# Patient Record
Sex: Male | Born: 1995 | Race: White | Hispanic: No | Marital: Single
Health system: Southern US, Community
[De-identification: ages and names within clinical notes are randomized; demographics above are authoritative.]

---
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
## 2017-07-15 ENCOUNTER — Emergency Department (HOSPITAL_COMMUNITY): Payer: No Typology Code available for payment source

## 2017-07-15 ENCOUNTER — Observation Stay (HOSPITAL_COMMUNITY)
Admission: EM | Admit: 2017-07-15 | Discharge: 2017-07-16 | Disposition: A | Discharge status: Nursing Home (Includes ICF) | Payer: No Typology Code available for payment source | Attending: Emergency Medicine | Admitting: Emergency Medicine

## 2017-07-15 ENCOUNTER — Other Ambulatory Visit: Payer: Self-pay

## 2017-07-15 DIAGNOSIS — S91319A Laceration without foreign body, unspecified foot, initial encounter: Secondary | ICD-10-CM | POA: Diagnosis present

## 2017-07-15 DIAGNOSIS — M545 Low back pain: Secondary | ICD-10-CM | POA: Insufficient documentation

## 2017-07-15 DIAGNOSIS — W19XXXA Unspecified fall, initial encounter: Secondary | ICD-10-CM

## 2017-07-15 DIAGNOSIS — M25572 Pain in left ankle and joints of left foot: Secondary | ICD-10-CM | POA: Diagnosis present

## 2017-07-15 DIAGNOSIS — Y990 Civilian activity done for income or pay: Secondary | ICD-10-CM | POA: Diagnosis not present

## 2017-07-15 DIAGNOSIS — S91312A Laceration without foreign body, left foot, initial encounter: Secondary | ICD-10-CM | POA: Diagnosis not present

## 2017-07-15 DIAGNOSIS — W11XXXA Fall on and from ladder, initial encounter: Secondary | ICD-10-CM | POA: Insufficient documentation

## 2017-07-15 MED ORDER — ONDANSETRON HCL 4 MG/2ML IJ SOLN
4.0000 mg | Freq: Once | INTRAMUSCULAR | Status: AC
  Administered 2017-07-15: 4 mg via INTRAVENOUS
  Filled 2017-07-15: qty 2

## 2017-07-15 MED ORDER — TETANUS-DIPHTH-ACELL PERTUSSIS 5-2.5-18.5 LF-MCG/0.5 IM SUSP
0.5000 mL | Freq: Once | INTRAMUSCULAR | Status: AC
  Administered 2017-07-15: 0.5 mL via INTRAMUSCULAR
  Filled 2017-07-15: qty 0.5

## 2017-07-15 MED ORDER — HYDROMORPHONE HCL 1 MG/ML IJ SOLN
0.7500 mg | Freq: Once | INTRAMUSCULAR | Status: AC
  Administered 2017-07-15: 0.75 mg via INTRAVENOUS
  Filled 2017-07-15: qty 1

## 2017-07-15 MED ORDER — HYDROMORPHONE HCL 1 MG/ML IJ SOLN
0.5000 mg | INTRAMUSCULAR | Status: DC | PRN
  Administered 2017-07-16: 1 mg via INTRAVENOUS
  Filled 2017-07-15: qty 1

## 2017-07-15 MED ORDER — CLINDAMYCIN PHOSPHATE 600 MG/50ML IV SOLN
600.0000 mg | Freq: Once | INTRAVENOUS | Status: AC
  Administered 2017-07-15: 600 mg via INTRAVENOUS
  Filled 2017-07-15: qty 50

## 2017-07-15 MED ORDER — SODIUM CHLORIDE 0.9 % IV SOLN
INTRAVENOUS | Status: AC
  Administered 2017-07-15: 1000 mL via INTRAVENOUS

## 2017-07-15 MED ORDER — LIDOCAINE-EPINEPHRINE (PF) 2 %-1:200000 IJ SOLN
20.0000 mL | Freq: Once | INTRAMUSCULAR | Status: DC
  Filled 2017-07-15: qty 20

## 2017-07-15 MED ORDER — BUPIVACAINE HCL 0.25 % IJ SOLN
20.0000 mL | Freq: Once | INTRAMUSCULAR | Status: AC
  Administered 2017-07-15: 20 mL
  Filled 2017-07-15: qty 20

## 2017-07-15 MED ORDER — HYDROMORPHONE HCL 1 MG/ML IJ SOLN
1.0000 mg | Freq: Once | INTRAMUSCULAR | Status: DC
  Filled 2017-07-15 (×2): qty 1

## 2017-07-15 MED ORDER — SODIUM CHLORIDE 0.9 % IV BOLUS: 125.0000 mL | Freq: Once | INTRAVENOUS | Status: DC

## 2017-07-15 MED ORDER — HYDROMORPHONE HCL 1 MG/ML IJ SOLN
1.0000 mg | Freq: Once | INTRAMUSCULAR | Status: AC
  Administered 2017-07-15: 1 mg via INTRAVENOUS

## 2017-07-15 MED ORDER — HYDROMORPHONE HCL 1 MG/ML IJ SOLN
0.5000 mg | Freq: Once | INTRAMUSCULAR | Status: AC
  Administered 2017-07-15: 0.5 mg via INTRAVENOUS
  Filled 2017-07-15: qty 1

## 2017-07-15 NOTE — ED Provider Notes (Signed)
MSE was initiated and I personally evaluated the patient and placed orders (if any) at  3:09 PM on Jul 15, 2017.  Logan Long is a 22 y.o. male who presents to the ED for a laceration to the bottom of his left heel, left foot and ankle pain and right ankle and knee pain and low back pain. Patient stepped down a ladder and turned his ankle inside his his boot. Patient fell and landed on his feet. Patient c/o severe pain bilateral feet and ankles and right knee. Patient c/o laceration to the left foot. Patient also with low back pain. The injury happened just prior to coming to the ED.   BP 120/74 (BP Location: Left Arm)   Pulse (!) 101   Temp 98.1 F (36.7 C) (Oral)   Resp (!) 22   Wt 117.9 kg (260 lb)   SpO2 97%   Deep laceration to the plantar aspect of the left foot, bleeding controlled. Swelling bilateral ankles, tender on palpation. Right knee tender with range of motion.  X-rays, IV, pain management with Dilaudid, Zofran for nausea.  The patient appears stable so that the remainder of the MSE may be completed by another provider.   Kerrie Buffalo [place], [place] 07/15/17 1516    Nira Conn, MD 07/15/17 [phone]

## 2017-07-15 NOTE — ED Triage Notes (Signed)
Deep open laceration to l/foot.Bleeding not controlled.  Pt stepped down off a ladder and turned his ankle onto the side of a steel boot. Pt stated that he landed on his r/foot also. 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 Injury was approx. 30 minutes ago.Pt stated that he fell from top of ten foot ladder. He was stepping  Off a ladder and missed a step.

## 2017-07-15 NOTE — ED Provider Notes (Signed)
[facility]-EMERGENCY DEPT Provider Note   CSN: [id number] Arrival date & time: 07/15/17  1439     History   Chief Complaint Chief Complaint  Patient presents with  . Ankle Pain  . Fall  . Extremity Laceration    HPI Logan Long is a 22 y.o. male who presents today for evaluation of bilateral heel pain.  He reports that he was at work when he was up on a ladder, he misstepped causing him to fall 10 to 12 feet landing on his bilateral feet.  He reports sudden onset of pain in both of his feet, and very mild lower back pain.  He denies striking his head, stating that "I stuck the landing."  He denies taking any blood thinning medications, reports that he is otherwise healthy.  He reports that he has a laceration on his left foot, is unsure when his last tetanus shot was.  He denies any other injuries, did not strike his head, no neck pain.  HPI  History reviewed. No pertinent past medical history.  Patient Active Problem List   Diagnosis Date Noted  . Foot laceration 07/15/2017    History reviewed. No pertinent surgical history.      Home Medications    Prior to Admission medications   Medication Sig Start Date End Date Taking? Authorizing Provider  Liniments (VICKS BABYRUB) CREA Apply 1 application topically daily as needed (congestion).   Yes [provider]    Family History History reviewed. No pertinent family history.  Social History Social History   Tobacco Use  . Smoking status: Not on file  Substance Use Topics  . Alcohol use: Not on file  . Drug use: Not on file     Allergies   Cefdinir and Rocephin [ceftriaxone sodium in dextrose]   Review of Systems Review of Systems  Constitutional: Negative for chills and fever.  HENT: Negative for ear pain and sore throat.   Eyes: Negative for pain and visual disturbance.  Respiratory: Negative for cough and shortness of breath.   Cardiovascular: Negative for chest pain and  palpitations.  Gastrointestinal: Negative for abdominal pain and vomiting.  Genitourinary: Negative for dysuria and hematuria.  Musculoskeletal: Negative for arthralgias and back pain.       Pain in bilateral feet/ankles, mild low back pain.  Skin: Negative for color change and rash.  Neurological: Negative for seizures, syncope and headaches.  All other systems reviewed and are negative.    Physical Exam Updated Vital Signs BP 117/80 (BP Location: Left Arm)   Pulse 83   Temp 98.1 F (36.7 C) (Oral)   Resp 18   Ht 5' 6.5" (1.689 m)   Wt 117.9 kg (260 lb)   SpO2 100%   BMI 41.34 kg/m   Physical Exam  Constitutional: He is oriented to person, place, and time. He appears well-developed and well-nourished. No distress.  HENT:  Head: Normocephalic and atraumatic.  Mouth/Throat: Oropharynx is clear and moist.  Eyes: Conjunctivae are normal.  Neck: Normal range of motion. Neck supple.  Cardiovascular: Normal rate, regular rhythm and intact distal pulses.  No murmur heard. 2+ DP/PT pulses bilaterally.  Pulmonary/Chest: Effort normal and breath sounds normal. No respiratory distress.  Abdominal: Soft. There is no tenderness.  Musculoskeletal: He exhibits no edema.  Diffuse tenderness to palpation over bilateral heels.  Neurological: He is alert and oriented to person, place, and time.  Sensation intact to bilateral lower extremities.  Skin: Skin is warm and dry. He  is not diaphoretic.  There is a 10 cm laceration over the medial aspect of the left heel.  Please see clinical images.  Psychiatric: He has a normal mood and affect.  Nursing note and vitals reviewed.    After irrigation, Minimal debridement of non viable skin.       ED Treatments / Results  Labs (all labs ordered are listed, but only abnormal results are displayed) Labs Reviewed - No data to display  EKG None  Radiology Dg Lumbar Spine Complete  Result Date: 07/15/2017 CLINICAL DATA:  Larey Seat from  ladder. LEFT foot laceration. Ankle swelling and pain. Low back pain. EXAM: LUMBAR SPINE - COMPLETE 4+ VIEW COMPARISON:  None. FINDINGS: Five non rib-bearing lumbar-type vertebral bodies are intact and aligned with maintenance of the lumbar lordosis. Intervertebral disc heights are normal. No destructive bony lesions. Sacroiliac joints are symmetric. Included prevertebral and paraspinal soft tissue planes are non-suspicious. IMPRESSION: Negative. Electronically Signed   By: Awilda Metro M.D.   On: 07/15/2017 16:36   Dg Ankle Complete Left  Result Date: 07/15/2017 CLINICAL DATA:  Larey Seat from ladder. LEFT foot laceration. Ankle swelling and pain. Low back pain. EXAM: LEFT FOOT - COMPLETE 3+ VIEW; LEFT ANKLE COMPLETE - 3+ VIEW COMPARISON:  None. FINDINGS: LEFT foot: There is no evidence of fracture or dislocation. There is no evidence of arthropathy or other focal bone abnormality. Soft tissues are unremarkable. LEFT ankle: No fracture deformity nor dislocation. Mild anterior and posterior tibial periarticular spurring. The ankle mortise appears congruent and the tibiofibular syndesmosis intact. No destructive bony lesions. Soft tissue planes are non-suspicious. IMPRESSION: Negative. Electronically Signed   By: Awilda Metro M.D.   On: 07/15/2017 16:33   Dg Ankle Complete Right  Result Date: 07/15/2017 CLINICAL DATA:  Larey Seat from ladder. LEFT foot laceration. Ankle swelling and pain. Low back pain. EXAM: RIGHT FOOT COMPLETE - 3+ VIEW; RIGHT ANKLE - COMPLETE 3+ VIEW COMPARISON:  None. FINDINGS: RIGHT foot: There is no evidence of fracture or dislocation. There is no evidence of arthropathy or other focal bone abnormality. Soft tissues are unremarkable. RIGHT ankle: No fracture deformity nor dislocation. Posterior calcaneal cortical irregularity. The ankle mortise appears congruent and the tibiofibular syndesmosis intact. No destructive bony lesions. Soft tissue planes are non-suspicious. IMPRESSION:  Posterior calcaneal cortical irregularity, recommend correlation with point tenderness. Electronically Signed   By: Awilda Metro M.D.   On: 07/15/2017 16:35   Ct Foot Left Wo Contrast  Result Date: 07/15/2017 CLINICAL DATA:  22 year old male with laceration to the left heel with pain. EXAM: CT OF THE LEFT FOOT WITHOUT CONTRAST TECHNIQUE: Multidetector CT imaging of the left foot was performed according to the standard protocol. Multiplanar CT image reconstructions were also generated. COMPARISON:  Left foot and ankle series 1605 hours today. FINDINGS: There is a prominent linear soft tissue laceration along the medial surface of the posterior foot at the heel which tracks to the deep muscle layer as demonstrated on series 4, image 67. Associated small volume soft tissue gas. See also series 10, image 50. On series 4, image 53 the soft tissue injury may extend to the medial periosteum of the calcaneus, uncertain. However, there is no associated calcaneus fracture or cortical irregularity. The remaining tarsal bones are also intact. The visible distal tibia and fibula are intact. The metatarsals and phalanges are intact. No ankle joint effusion. No soft tissue fluid collection elsewhere. IMPRESSION: 1. Prominent soft tissue laceration along the medial surface of the posterior foot at the  heel which tracks to the deep muscle layer (series 4, image 67), and possibly also to the periosteum of the medial calcaneus (series 4, image 53). 2. However, there is no associated left foot or ankle fracture. 3. No joint effusion or soft tissue fluid collection. Electronically Signed   By: Odessa Fleming M.D.   On: 07/15/2017 19:50   Ct Foot Right Wo Contrast  Result Date: 07/15/2017 CLINICAL DATA:  22 year old male with laceration to the left heel, and status post fall landing on feet. Pain. EXAM: CT OF THE RIGHT FOOT WITHOUT CONTRAST TECHNIQUE: Multidetector CT imaging of the right foot was performed according to the  standard protocol. Multiplanar CT image reconstructions were also generated. COMPARISON:  Contralateral left foot series today reported separately. FINDINGS: Calcaneus intact. No acute fracture of the distal tibia or fibula. There is a small chronic appearing ossific fragment at the anteromedial malleolus. The talus and other tarsal bones appear intact. The metatarsals appear intact. No phalanx fracture identified. There is soft tissue stranding along the plantar surface of the calcaneus. No discrete soft tissue hematoma. No soft tissue gas. IMPRESSION: Negative for acute fracture of the left foot or ankle. Electronically Signed   By: Odessa Fleming M.D.   On: 07/15/2017 20:17   Dg Foot Complete Left  Result Date: 07/15/2017 CLINICAL DATA:  Larey Seat from ladder. LEFT foot laceration. Ankle swelling and pain. Low back pain. EXAM: LEFT FOOT - COMPLETE 3+ VIEW; LEFT ANKLE COMPLETE - 3+ VIEW COMPARISON:  None. FINDINGS: LEFT foot: There is no evidence of fracture or dislocation. There is no evidence of arthropathy or other focal bone abnormality. Soft tissues are unremarkable. LEFT ankle: No fracture deformity nor dislocation. Mild anterior and posterior tibial periarticular spurring. The ankle mortise appears congruent and the tibiofibular syndesmosis intact. No destructive bony lesions. Soft tissue planes are non-suspicious. IMPRESSION: Negative. Electronically Signed   By: Awilda Metro M.D.   On: 07/15/2017 16:33   Dg Foot Complete Right  Result Date: 07/15/2017 CLINICAL DATA:  Larey Seat from ladder. LEFT foot laceration. Ankle swelling and pain. Low back pain. EXAM: RIGHT FOOT COMPLETE - 3+ VIEW; RIGHT ANKLE - COMPLETE 3+ VIEW COMPARISON:  None. FINDINGS: RIGHT foot: There is no evidence of fracture or dislocation. There is no evidence of arthropathy or other focal bone abnormality. Soft tissues are unremarkable. RIGHT ankle: No fracture deformity nor dislocation. Posterior calcaneal cortical irregularity. The ankle  mortise appears congruent and the tibiofibular syndesmosis intact. No destructive bony lesions. Soft tissue planes are non-suspicious. IMPRESSION: Posterior calcaneal cortical irregularity, recommend correlation with point tenderness. Electronically Signed   By: Awilda Metro M.D.   On: 07/15/2017 16:35    Procedures Irrigation and debridement Date/Time: 07/16/2017 1:21 AM Performed by: Cristina Gong, PA-C Authorized by: Cristina Gong, PA-C  Consent: Verbal consent obtained. Risks and benefits: risks, benefits and alternatives were discussed Consent given by: patient Patient understanding: patient states understanding of the procedure being performed Patient identity confirmed: verbally with patient Time out: Immediately prior to procedure a "time out" was called to verify the correct patient, procedure, equipment, support staff and site/side marked as required. Local anesthesia used: yes  Anesthesia: Local anesthesia used: yes Local Anesthetic: bupivacaine 0.25% without epinephrine  Sedation: Patient sedated: no  Comments: Obviously nonviable/avulsed superficial skin was removed.  Wound was extensively irrigated with normal saline and Betadine mixed with saline.  After this it was covered with Betadine soaked gauze and wrapped with a bulky sterile dressing.    (  including critical care time)    Medications Ordered in ED Medications  HYDROmorphone (DILAUDID) injection 1 mg (1 mg Intravenous Not Given 07/15/17 1531)  sodium chloride 0.9 % bolus 125 mL (0 mLs Intravenous Stopped 07/15/17 1845)  HYDROmorphone (DILAUDID) injection 0.5 mg (1 mg Intravenous Given 07/16/17 0009)  ondansetron (ZOFRAN) injection 4 mg (4 mg Intravenous Given 07/15/17 1531)  HYDROmorphone (DILAUDID) injection 1 mg (1 mg Intravenous Given 07/15/17 1547)  HYDROmorphone (DILAUDID) injection 0.5 mg (0.5 mg Intravenous Given 07/15/17 1815)  Tdap (BOOSTRIX) injection 0.5 mL (0.5 mLs Intramuscular Given  07/15/17 1819)  HYDROmorphone (DILAUDID) injection 0.5 mg (0.5 mg Intravenous Given 07/15/17 1952)  HYDROmorphone (DILAUDID) injection 0.75 mg (0.75 mg Intravenous Given 07/15/17 2111)  bupivacaine (MARCAINE) 0.25 % (with pres) injection 20 mL (20 mLs Infiltration Given 07/15/17 2145)  clindamycin (CLEOCIN) IVPB 600 mg (0 mg Intravenous Stopped 07/15/17 2329)  0.9 %  sodium chloride infusion ( Intravenous Stopped 07/16/17 0112)  gi cocktail (Maalox,Lidocaine,Donnatal) (30 mLs Oral Given 07/16/17 0052)     Initial Impression / Assessment and Plan / ED Course  I have reviewed the triage vital signs and the nursing notes.  Pertinent labs & imaging results that were available during my care of the patient were reviewed by me and considered in my medical decision making (see chart for details).  Clinical Course as of Jul 16 121  Tue Jul 15, 2017  2117 Spoke with ortho on call who will view image and call me back.    [EH]  Wed Jul 16, 2017  0002 Rn informed me that patient wishes to go to [facility].  I discussed with patient who wants to go to [facility], not to [facility].  Informed patient that he has a bed and is scheduled for surgery tomorrow and he may not get that at [facility].  He states his understanding.     [EH]  0022 Dr. Al Corpus from [facility] will be accepting.  In the Ed.    [EH]    Clinical Course User Index [EH] Cristina Gong, PA-C   Patient presents today for evaluation after a fall.  He was on a 10 to 12 foot ladder when he misstepped causing him to fall onto concrete landing on his feet.  He did not strike his head, pass out, reports pain in his bilateral feet, knees, and lower back.  X-rays were obtained showing concern for right calcaneus fracture.  Clinically he has a large laceration on his left heel.  Based on need for further evaluation of possible fractures, CTs of bilateral feet were obtained showing there are no fractures bilaterally.  There are air pockets and soft  tissue findings very close to the bone of the left calcaneus consistent with very deep laceration.  I spoke with on-call orthopedics Dr. August Saucer who requested antibiotics, to cover the wound with iodine gauze after irrigation, and transferred to [facility] for admission with OR tomorrow.  Patient is allergic to cephalosporins, was given IV clindamycin for antibiotic prophylaxis.  Patient was admitted with bed placement over at [facility] when he decided that he instead wished to go to [facility].  I spoke with Dr. Al Corpus from [facility] ED who will accept the patient in transfer.  Patient's pain was treated in the emergency room with Dilaudid, which kept him comfortable.  Patient was also numbed with Marcaine for continued pain control in a multimodal fashion.  Patient will be transferred to [facility].  Tdap updated.    Final  Clinical Impressions(s) / ED Diagnoses   Final diagnoses:  Fall, initial encounter  Foot laceration, left, initial encounter    ED Discharge Orders    None       Logan Long 07/16/17 0123    Loren Racer, MD 07/21/17 1520

## 2017-07-16 ENCOUNTER — Encounter (HOSPITAL_COMMUNITY): Payer: Self-pay | Admitting: Physician Assistant

## 2017-07-16 MED ORDER — GI COCKTAIL ~~LOC~~
30.0000 mL | Freq: Once | ORAL | Status: AC
  Administered 2017-07-16: 30 mL via ORAL
  Filled 2017-07-16: qty 30

## 2017-07-16 NOTE — ED Notes (Signed)
Report given to [facility] for transport to [facility].

## 2017-07-16 NOTE — ED Notes (Signed)
Pt c/o heartburn, requesting tums or med to help.  Informed would speak to PA-C.

## 2017-07-16 NOTE — ED Notes (Signed)
[facility] with [facility] is at bedside to pick patient up for transfer to [facility] ED. Patient was transported out of facility via stretcher. Patient's family has his belongings and the transport team has the necessary paperwork for transfer.

## 2017-07-16 NOTE — ED Notes (Signed)
Pt now requesting to be transferred to [facility].  Informed would speak to provider.

## 2019-06-13 IMAGING — CT CT FOOT*L* W/O CM
2 of 3 series · 13 of 30 positions shown, 15 images · non-contrast
Comparison: Left foot and ankle series 0126 hours today.

CLINICAL DATA: 22-year-old male with laceration to the left heel
with pain.

EXAM:
CT OF THE LEFT FOOT WITHOUT CONTRAST
TECHNIQUE: Multidetector CT imaging of the left foot was performed according to
the standard protocol. Multiplanar CT image reconstructions were
also generated.

[Series 4: axial st left · axial · 0.50mm/px · z∈[-1394,-1212]mm · 7 of 143 slices shown, 9 images]
[im 11/143  soft-tissue]
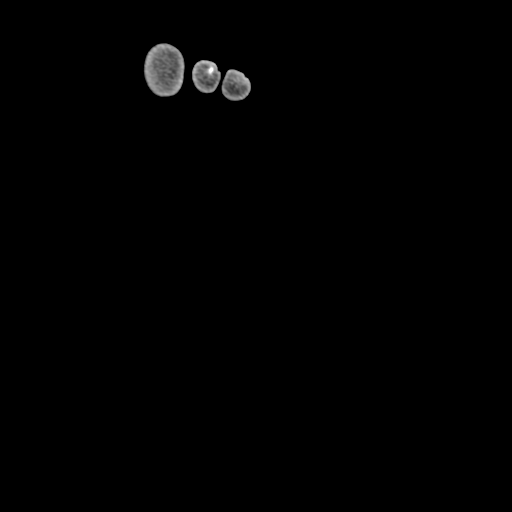
[im 11/143  bone]
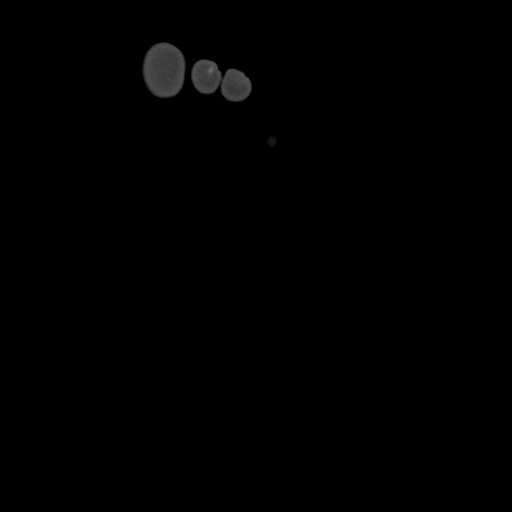
[im 33/143  bone]
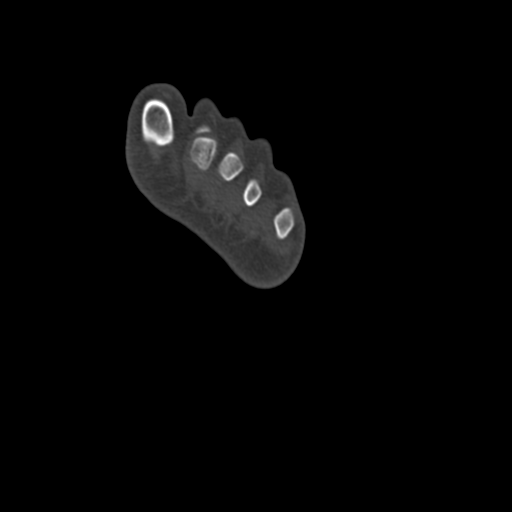
[im 55/143  bone]
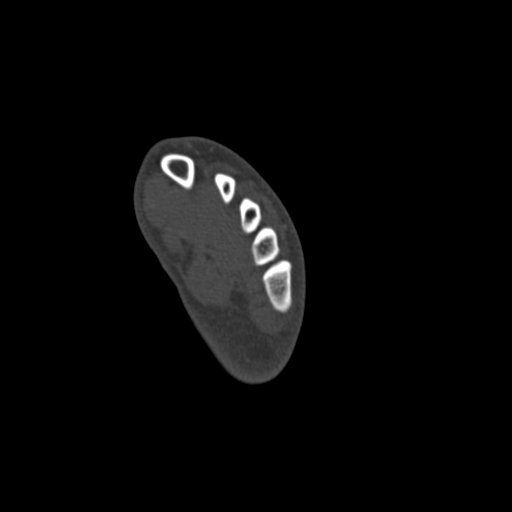
[im 77/143  bone]
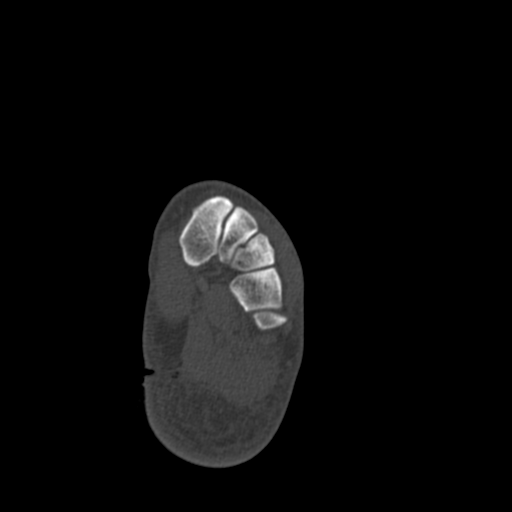
[im 88/143  soft-tissue]
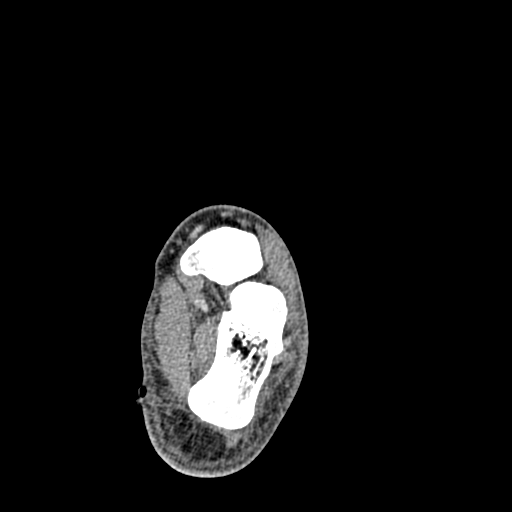
[im 88/143  bone]
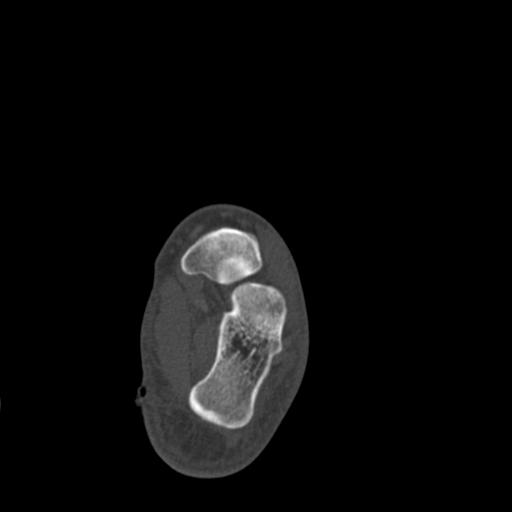
[im 110/143  bone]
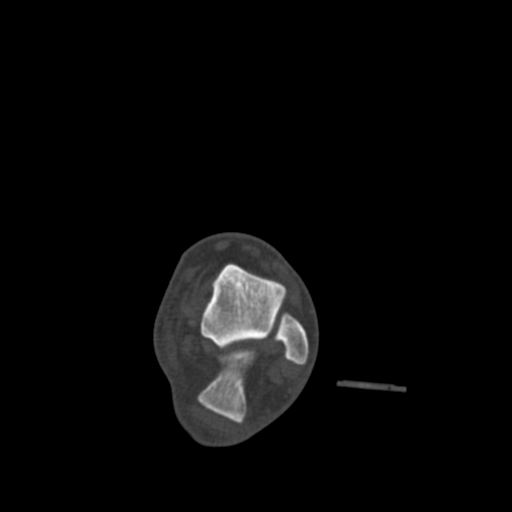
[im 132/143  bone]
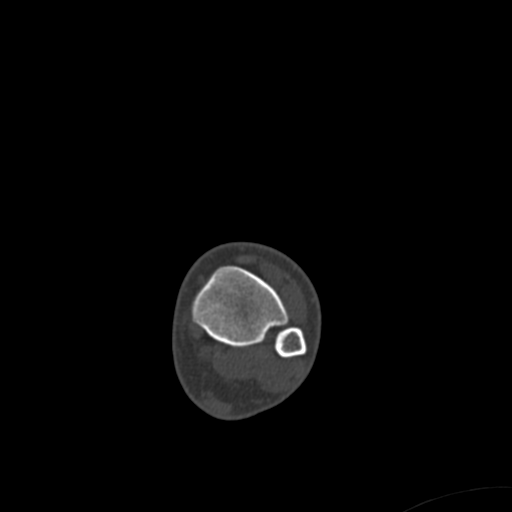

[Series 8: sagittal bone left · sagittal · 0.32mm/px · 6 of 96 slices shown]
[im 16/96  bone]
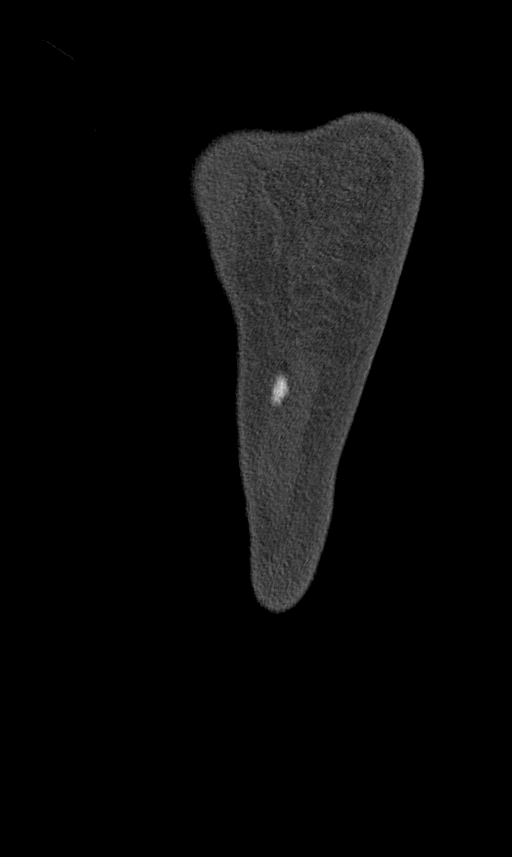
[im 30/96  soft-tissue]
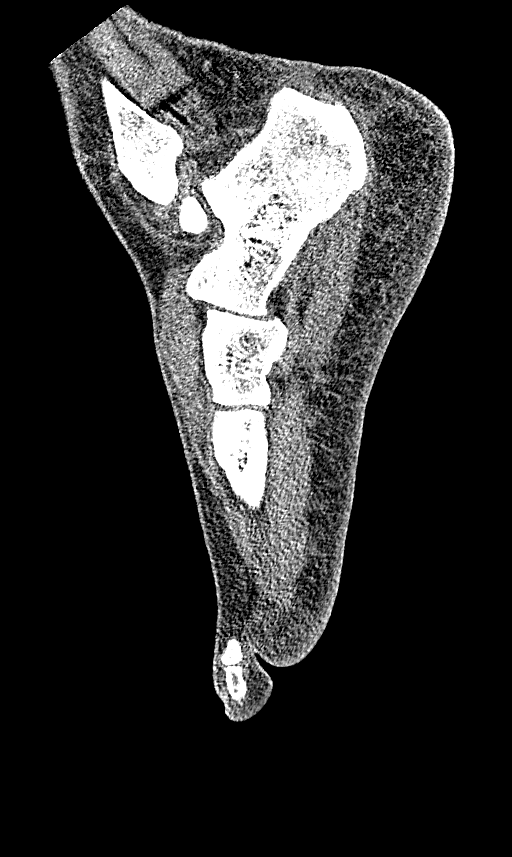
[im 32/96  bone]
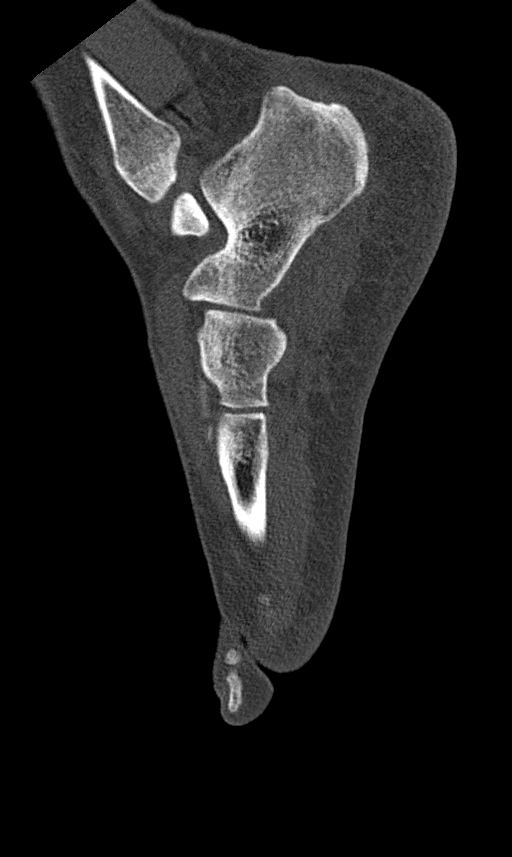
[im 48/96  bone]
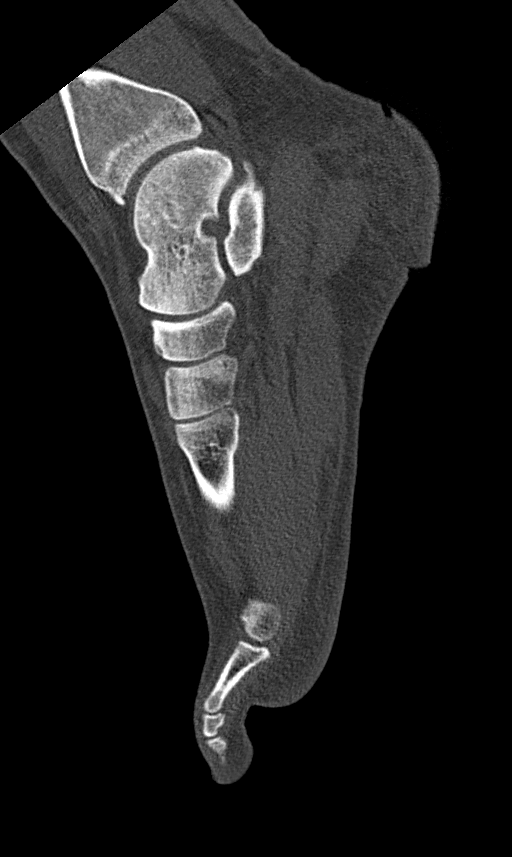
[im 64/96  bone]
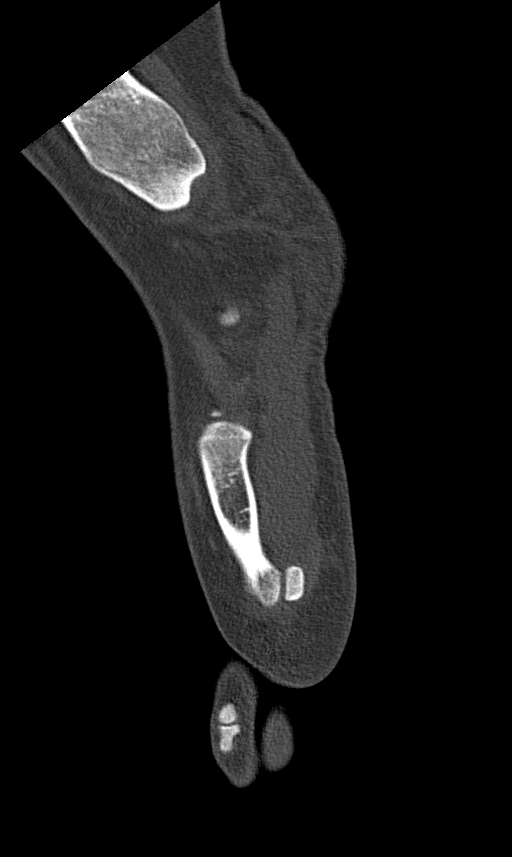
[im 80/96  bone]
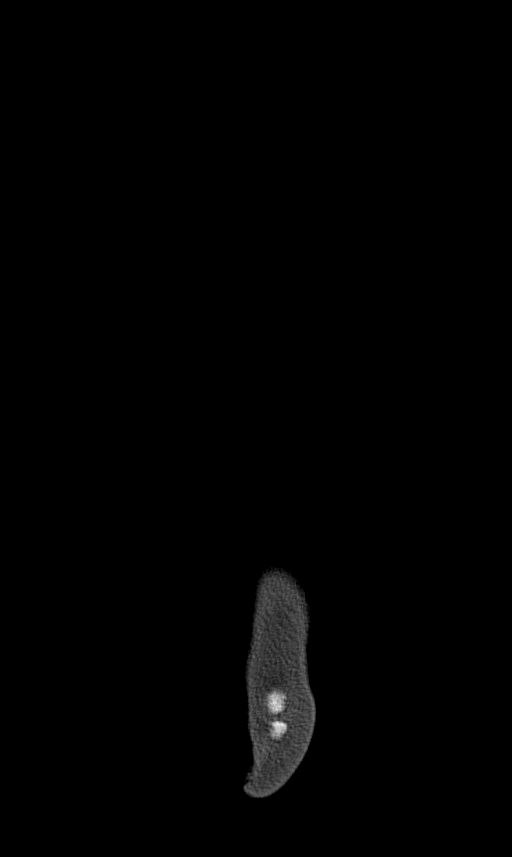

[13 of 30 positions shown; findings below may reference images not displayed]

FINDINGS: There is a prominent linear soft tissue laceration along the medial
surface of the posterior foot at the heel which tracks to the deep
muscle layer as demonstrated on series 4, image 67. Associated small
volume soft tissue gas. See also series 10, image 50. On series 4,
image 53 the soft tissue injury may extend to the medial periosteum
of the calcaneus, uncertain. However, there is no associated
calcaneus fracture or cortical irregularity.

The remaining tarsal bones are also intact. The visible distal tibia
and fibula are intact. The metatarsals and phalanges are intact. No
ankle joint effusion. No soft tissue fluid collection elsewhere.
IMPRESSION: 1. Prominent soft tissue laceration along the medial surface of the
posterior foot at the heel which tracks to the deep muscle layer
(series 4, image 67), and possibly also to the periosteum of the
medial calcaneus (series 4, image 53).
2. However, there is no associated left foot or ankle fracture.
3. No joint effusion or soft tissue fluid collection.

## 2019-06-13 IMAGING — CR DG FOOT COMPLETE 3+V*R*
3 series · 3 of 3 positions shown · non-contrast
Comparison: None.

CLINICAL DATA: Fell from ladder. LEFT foot laceration. Ankle
swelling and pain. Low back pain.

EXAM:
RIGHT FOOT COMPLETE - 3+ VIEW; RIGHT ANKLE - COMPLETE 3+ VIEW

[t foot ap right]
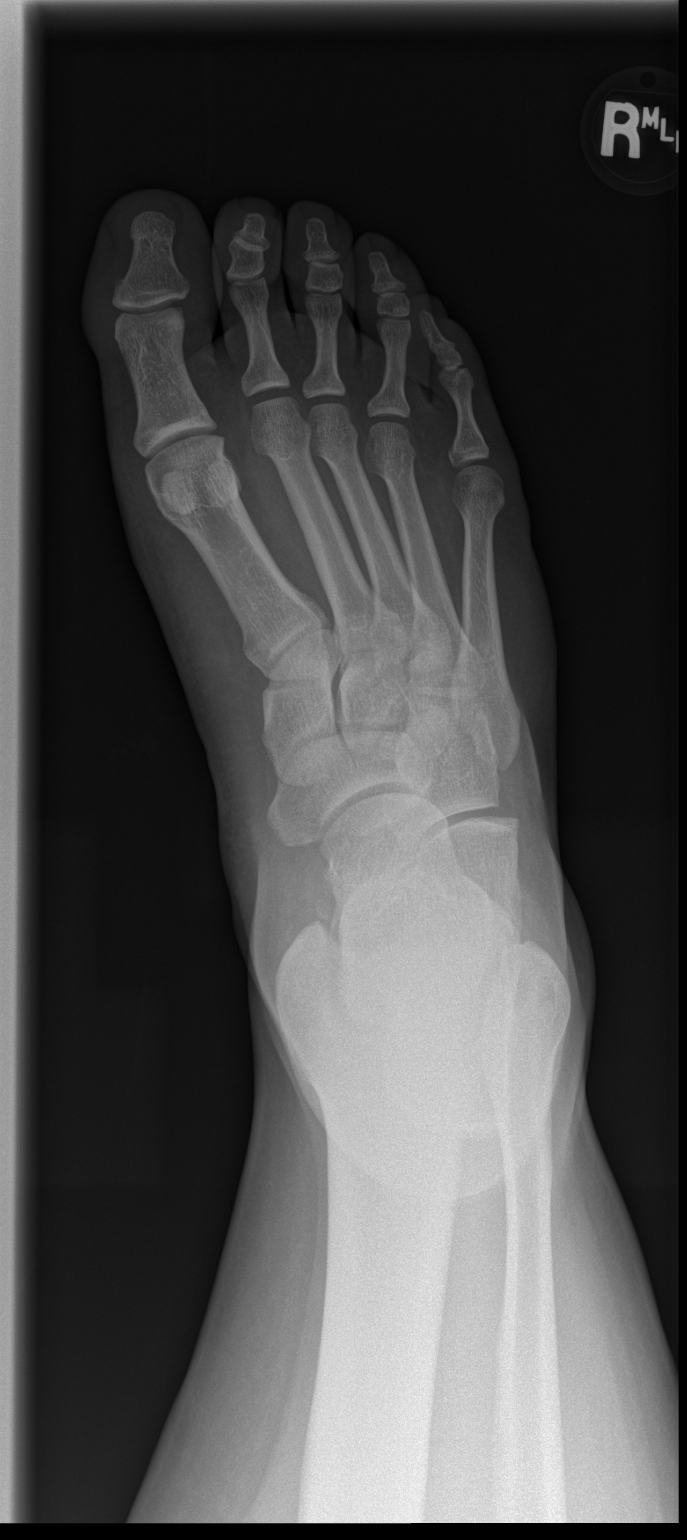

[t foot obl right]
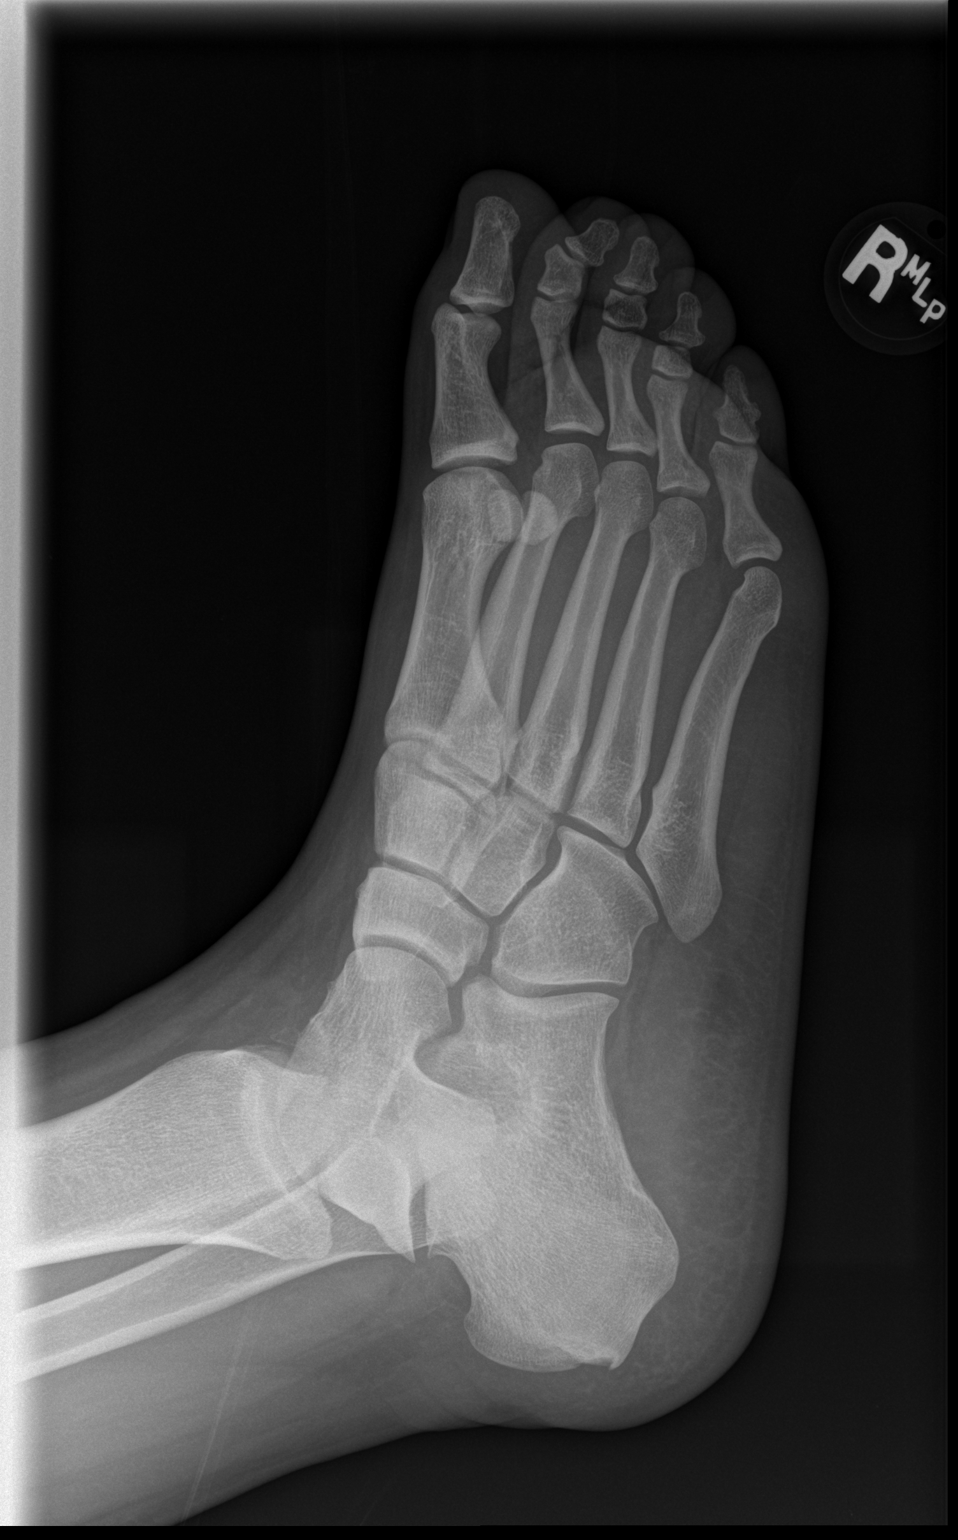

[t foot lat right]
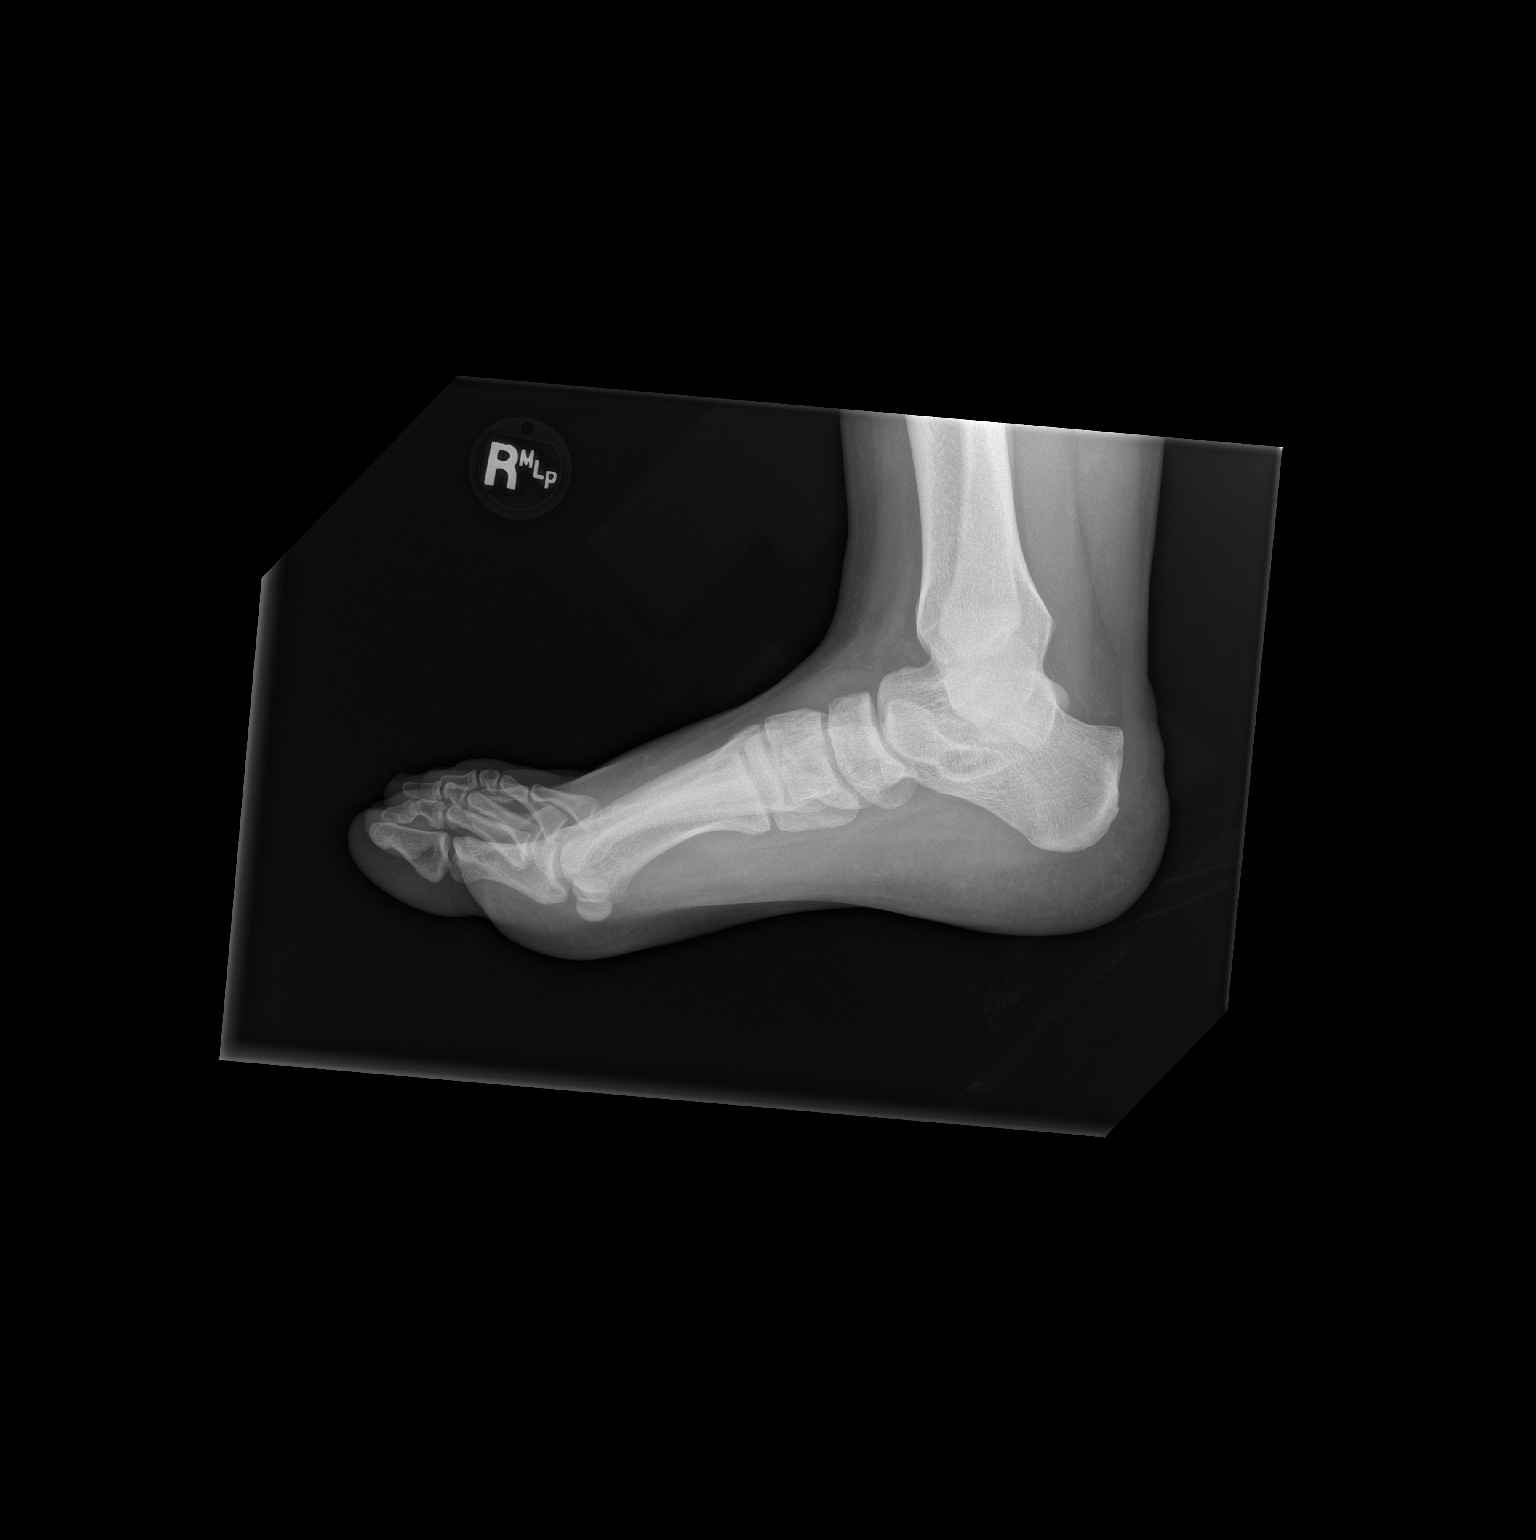

[3 of 3 positions shown; findings below may reference images not displayed]

FINDINGS: RIGHT foot: There is no evidence of fracture or dislocation. There
is no evidence of arthropathy or other focal bone abnormality. Soft
tissues are unremarkable.

RIGHT ankle: No fracture deformity nor dislocation. Posterior
calcaneal cortical irregularity. The ankle mortise appears congruent
and the tibiofibular syndesmosis intact. No destructive bony
lesions. Soft tissue planes are non-suspicious.
IMPRESSION: Posterior calcaneal cortical irregularity, recommend correlation
with point tenderness.
